# Patient Record
Sex: Female | Born: 1990 | Race: White | Hispanic: No | Marital: Single | Smoking: Current every day smoker
Health system: Southern US, Community
[De-identification: ages and names within clinical notes are randomized; demographics above are authoritative.]

## PROBLEM LIST (undated history)

## (undated) DIAGNOSIS — H269 Unspecified cataract: Secondary | ICD-10-CM

## (undated) DIAGNOSIS — F32A Depression, unspecified: Secondary | ICD-10-CM

## (undated) DIAGNOSIS — F329 Major depressive disorder, single episode, unspecified: Secondary | ICD-10-CM

## (undated) DIAGNOSIS — F419 Anxiety disorder, unspecified: Secondary | ICD-10-CM

## (undated) DIAGNOSIS — N809 Endometriosis, unspecified: Secondary | ICD-10-CM

## (undated) HISTORY — DX: Depression, unspecified: F32.A

## (undated) HISTORY — PX: NO PAST SURGERIES: SHX2092

## (undated) HISTORY — DX: Unspecified cataract: H26.9

## (undated) HISTORY — DX: Endometriosis, unspecified: N80.9

## (undated) HISTORY — DX: Anxiety disorder, unspecified: F41.9

## (undated) HISTORY — DX: Major depressive disorder, single episode, unspecified: F32.9

---
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
## 2012-07-10 ENCOUNTER — Emergency Department: Payer: Self-pay | Admitting: Emergency Medicine

## 2012-07-14 LAB — BETA STREP CULTURE(ARMC)

## 2013-06-11 DIAGNOSIS — IMO0002 Reserved for concepts with insufficient information to code with codable children: Secondary | ICD-10-CM | POA: Insufficient documentation

## 2013-06-17 DIAGNOSIS — F329 Major depressive disorder, single episode, unspecified: Secondary | ICD-10-CM | POA: Insufficient documentation

## 2013-06-20 DIAGNOSIS — N809 Endometriosis, unspecified: Secondary | ICD-10-CM | POA: Insufficient documentation

## 2013-06-20 DIAGNOSIS — R030 Elevated blood-pressure reading, without diagnosis of hypertension: Secondary | ICD-10-CM

## 2013-06-29 DIAGNOSIS — M549 Dorsalgia, unspecified: Secondary | ICD-10-CM | POA: Insufficient documentation

## 2013-07-23 DIAGNOSIS — H269 Unspecified cataract: Secondary | ICD-10-CM | POA: Insufficient documentation

## 2013-10-02 DIAGNOSIS — Z720 Tobacco use: Secondary | ICD-10-CM | POA: Insufficient documentation

## 2013-11-28 DIAGNOSIS — R252 Cramp and spasm: Secondary | ICD-10-CM | POA: Insufficient documentation

## 2013-11-28 DIAGNOSIS — Z1389 Encounter for screening for other disorder: Secondary | ICD-10-CM | POA: Insufficient documentation

## 2014-01-30 DIAGNOSIS — H1010 Acute atopic conjunctivitis, unspecified eye: Secondary | ICD-10-CM | POA: Insufficient documentation

## 2014-01-30 DIAGNOSIS — R51 Headache: Secondary | ICD-10-CM

## 2014-01-30 DIAGNOSIS — F419 Anxiety disorder, unspecified: Secondary | ICD-10-CM | POA: Insufficient documentation

## 2014-01-30 DIAGNOSIS — R519 Headache, unspecified: Secondary | ICD-10-CM | POA: Insufficient documentation

## 2014-03-11 ENCOUNTER — Emergency Department: Payer: Self-pay | Admitting: Emergency Medicine

## 2014-03-11 LAB — URINALYSIS, COMPLETE
BILIRUBIN, UR: NEGATIVE
BLOOD: NEGATIVE
GLUCOSE, UR: NEGATIVE mg/dL (ref 0–75)
Ketone: NEGATIVE
NITRITE: NEGATIVE
PH: 5 (ref 4.5–8.0)
Protein: NEGATIVE
RBC,UR: 9 /HPF (ref 0–5)
SPECIFIC GRAVITY: 1.013 (ref 1.003–1.030)
WBC UR: 27 /HPF (ref 0–5)

## 2014-07-01 DIAGNOSIS — G4730 Sleep apnea, unspecified: Secondary | ICD-10-CM | POA: Insufficient documentation

## 2014-07-01 DIAGNOSIS — K219 Gastro-esophageal reflux disease without esophagitis: Secondary | ICD-10-CM | POA: Insufficient documentation

## 2014-07-01 DIAGNOSIS — J351 Hypertrophy of tonsils: Secondary | ICD-10-CM | POA: Insufficient documentation

## 2014-08-08 DIAGNOSIS — M62830 Muscle spasm of back: Secondary | ICD-10-CM | POA: Insufficient documentation

## 2014-10-14 ENCOUNTER — Ambulatory Visit: Payer: Self-pay

## 2015-11-26 ENCOUNTER — Other Ambulatory Visit: Payer: Self-pay | Admitting: Ophthalmology

## 2015-11-26 ENCOUNTER — Ambulatory Visit
Admission: RE | Admit: 2015-11-26 | Discharge: 2015-11-26 | Disposition: A | Discharge status: Home / Self Care | Payer: Self-pay | Source: Ambulatory Visit | Attending: Ophthalmology | Admitting: Ophthalmology

## 2015-11-26 DIAGNOSIS — H4710 Unspecified papilledema: Secondary | ICD-10-CM

## 2015-11-26 MED ORDER — GADOBENATE DIMEGLUMINE 529 MG/ML IV SOLN
20.0000 mL | Freq: Once | INTRAVENOUS | Status: AC | PRN
  Administered 2015-11-26: 20 mL via INTRAVENOUS

## 2015-11-27 ENCOUNTER — Ambulatory Visit (INDEPENDENT_AMBULATORY_CARE_PROVIDER_SITE_OTHER): Payer: Self-pay | Admitting: Neurology

## 2015-11-27 ENCOUNTER — Encounter: Payer: Self-pay | Admitting: Neurology

## 2015-11-27 ENCOUNTER — Telehealth: Payer: Self-pay | Admitting: Neurology

## 2015-11-27 ENCOUNTER — Other Ambulatory Visit (INDEPENDENT_AMBULATORY_CARE_PROVIDER_SITE_OTHER): Payer: Self-pay

## 2015-11-27 VITALS — BP 132/86 | HR 92 | Resp 18 | Ht 60.0 in | Wt 265.0 lb

## 2015-11-27 DIAGNOSIS — G932 Benign intracranial hypertension: Secondary | ICD-10-CM

## 2015-11-27 DIAGNOSIS — E669 Obesity, unspecified: Secondary | ICD-10-CM

## 2015-11-27 LAB — CBC
HEMATOCRIT: 43.2 % (ref 36.0–46.0)
HEMOGLOBIN: 14.3 g/dL (ref 12.0–15.0)
MCHC: 33 g/dL (ref 30.0–36.0)
MCV: 88.1 fl (ref 78.0–100.0)
PLATELETS: 299 10*3/uL (ref 150.0–400.0)
RBC: 4.9 Mil/uL (ref 3.87–5.11)
RDW: 14.8 % (ref 11.5–15.5)
WBC: 10.9 10*3/uL — AB (ref 4.0–10.5)

## 2015-11-27 LAB — BASIC METABOLIC PANEL
BUN: 10 mg/dL (ref 6–23)
CALCIUM: 9.4 mg/dL (ref 8.4–10.5)
CHLORIDE: 105 meq/L (ref 96–112)
CO2: 25 meq/L (ref 19–32)
CREATININE: 0.64 mg/dL (ref 0.40–1.20)
GFR: 120.39 mL/min (ref >60.00)
GLUCOSE: 113 mg/dL — AB (ref 70–99)
Potassium: 4.3 mEq/L (ref 3.5–5.1)
Sodium: 137 mEq/L (ref 135–145)

## 2015-11-27 MED ORDER — ACETAZOLAMIDE 250 MG PO TABS: ORAL_TABLET | ORAL | Status: AC

## 2015-11-27 NOTE — Telephone Encounter (Signed)
Lmovm to rtn my call. 

## 2015-11-27 NOTE — Progress Notes (Signed)
NEUROLOGY CONSULTATION NOTE  Connie Murphy MRN: [id number] DOB: 05/29/1991  Referring provider: Dr. Lia Hopping Primary care provider: Dr. Lia Hopping  Reason for consult:  Bilateral optic nerve head edema  Dear Dr Marcy Panning:  Thank you for your kind referral of Connie Murphy for consultation of the above symptoms. Although her history is well known to you, please allow me to reiterate it for the purpose of our medical record. The patient was accompanied to the clinic by her mother who also provides collateral information. Records and images were personally reviewed where available.  HISTORY OF PRESENT ILLNESS: This is a 25 year old right-handed woman with a history of migraines, congenital cataracts, obesity, presenting for evaluation of bilateral optic nerve head edema seen on her eye doctor visit yesterday. She started noticing symptoms 3 days ago when she woke up with a dark spot in the middle of her vision in the right eye. She had a headache that day, a little different from her usual migraines, hurting "all over," but more over the middle frontal region. She saw her eye doctor yesterday and was found to have florid 3+ optic nerve head edema bilaterally, R>L with flame hemorrhages surrounding both optic nerves. VA was 20/20 in right eye, 20/60 in left eye, visual fields showed generalized depression in both eyes. She denies any eye pain. She has had intermittent pulsatile tinnitus for many years, and had told her PCP about this 2-3 years ago. He sent her for an eye exam at that time, with normal fundoscopy. She was started on Imitrex and Topamax at that time for migraines, which caused side effects. She stopped medications over a year ago. Imitrex made her feel like she could not move and caused high anxiety. She has migraines twice a week, lasting from 2-3 hours to several days, with associated nausea, vomiting, photosensitivity. She feels her ears become more sensitive before  a migraine, no visual obscurations. Tylenol then Excedrin migraine usually helps, but if migraines last for 2 days, she smokes weed and feels better after. She started having migraines in childhood, worse after her father passed away in 5.   She has a history of congenital cataracts, L>R, and has not noticed any vision changes in her left eye. She denies any diplopia, dysarthria, dysphagia, neck pain, focal numbness/tingling/weakness, bowel/bladder dysfunction. She feels some tenderness over the right nasal region and the right side of her face felt different after the MRI yesterday. Her maternal grandmother had migraines. She denies taking any new medications recently. She had a concussion 5 years ago. She reports weight gain, she usually runs between 235-245 lbs but feels she gained more since September.   She had an MRI brain and orbits with and without contrast yesterday reported as normal, optic chiasm normal, bilateral optic nerve signal and lack of enhancement appears symmetric and within normal.  PAST MEDICAL HISTORY: No past medical history on file.  PAST SURGICAL HISTORY: No past surgical history on file.  MEDICATIONS: Prn Tylenol Prn Excedrin Migraine   No current facility-administered medications on file prior to visit.    ALLERGIES: Allergies not on file  FAMILY HISTORY: No family history on file.  SOCIAL HISTORY: Social History   Social History  . Marital Status: Single    Spouse Name: N/A  . Number of Children: N/A  . Years of Education: N/A   Occupational History  . Not on file.   Social History Main Topics  . Smoking status: Not on file  . Smokeless  tobacco: Not on file  . Alcohol Use: Not on file  . Drug Use: Not on file  . Sexual Activity: Not on file   Other Topics Concern  . Not on file   Social History Narrative  . No narrative on file    REVIEW OF SYSTEMS: Constitutional: No fevers, chills, or sweats, no generalized fatigue, change in  appetite Eyes: No visual changes, double vision, eye pain Ear, nose and throat: No hearing loss, ear pain, nasal congestion, sore throat Cardiovascular: No chest pain, palpitations Respiratory:  No shortness of breath at rest or with exertion, wheezes GastrointestinaI: No nausea, vomiting, diarrhea, abdominal pain, fecal incontinence Genitourinary:  No dysuria, urinary retention or frequency Musculoskeletal:  No neck pain, + occl back pain from endometriosis Integumentary: No rash, pruritus, skin lesions Neurological: as above Psychiatric: No depression, insomnia, anxiety Endocrine: No palpitations, fatigue, diaphoresis, mood swings, change in appetite, change in weight, increased thirst Hematologic/Lymphatic:  No anemia, purpura, petechiae. Allergic/Immunologic: no itchy/runny eyes, nasal congestion, recent allergic reactions, rashes  PHYSICAL EXAM: Filed Vitals:   11/27/15 0857  BP: 132/86  Pulse: 92  Resp: 18   General: No acute distress Head:  Normocephalic/atraumatic Eyes: Fundoscopic exam: unable to visualize fundi on left due to cataract; blurred disk on right Neck: supple, no paraspinal tenderness, full range of motion Back: No paraspinal tenderness Heart: regular rate and rhythm Lungs: Clear to auscultation bilaterally. Vascular: No carotid bruits. Skin/Extremities: No rash, no edema Neurological Exam: Mental status: alert and oriented to person, place, and time, no dysarthria or aphasia, Fund of knowledge is appropriate.  Recent and remote memory are intact.  Attention and concentration are normal.    Able to name objects and repeat phrases. Cranial nerves: CN I: not tested CN II: pupils equal, round and reactive to light, visual fields intact, Fundoscopic exam: unable to visualize fundi on left due to cataract; blurred disk on right CN III, IV, VI:  full range of motion, no nystagmus, no ptosis CN V: facial sensation intact CN VII: upper and lower face symmetric CN  VIII: hearing intact to finger rub CN IX, X: gag intact, uvula midline CN XI: sternocleidomastoid and trapezius muscles intact CN XII: tongue midline Bulk & Tone: normal, no fasciculations. Motor: 5/5 throughout with no pronator drift. Sensation: intact to light touch, cold, pin, vibration and joint position sense.  No extinction to double simultaneous stimulation.  Romberg test negative Deep Tendon Reflexes: +1 throughout, no ankle clonus Plantar responses: downgoing bilaterally Cerebellar: no incoordination on finger to nose, heel to shin. No dysdiadochokinesia Gait: narrow-based and steady, able to tandem walk adequately. Tremor: none  IMPRESSION: This is a 25 year old right-handed woman with a history of migraines, congenital cataracts, obesity, presenting for bilateral optic nerve edema. MRI brain did not show any acute changes. She has a history of migraines and used to take Topamax with side effects. We discussed diagnosis of idiopathic intracranial hypertension, she is very anxious about having a lumbar puncture. Discussed the importance of doing an LP. This will be done under fluoroscopy, she will be started on Acetazolamide 250mg  1/2 tab BID for 1 week, then increase to 1 tab BID. We discussed that we would likely need to increase dose, LP would be helpful as well to guide further management. Continue with ophthalmology visits. We discussed the importance of weight loss in patient with IIH. She will follow-up in 1 months and knows to call for any changes.   Thank you for allowing me to  participate in the care of this patient. Please do not hesitate to call for any questions or concerns.   Patrcia Dolly, M.D.  CC: Dr. Marcy Panning

## 2015-11-27 NOTE — Telephone Encounter (Signed)
Patient returned my call. She states the cheapest price she could find [facility]. She states she went ahead and got the Rx because she states you & her eye doctor told her she really needed to be on medication. Wants to know if there is a cheaper alternative. She is having her LP on Monday morning.

## 2015-11-27 NOTE — Telephone Encounter (Signed)
Pt left message on the voice mail and needs to talk to someone about medication please call [phone]

## 2015-11-27 NOTE — Patient Instructions (Addendum)
1. Start acetazolamide 500mg : Take 1/2 tablet twice a day for 1 week, then increase to 1 tablet twice a day 2. Bloodwork for CBC, BMP, PT/PTT 3. Schedule lumbar pucture to measure opening pressure 4. Weight loss is an important part of your treatment 5. Follow-up with eye doctor as scheduled 6. Follow-up in 1 month, call for any changes

## 2015-11-28 ENCOUNTER — Encounter: Payer: Self-pay | Admitting: Neurology

## 2015-11-28 DIAGNOSIS — E669 Obesity, unspecified: Secondary | ICD-10-CM | POA: Insufficient documentation

## 2015-11-28 DIAGNOSIS — G932 Benign intracranial hypertension: Secondary | ICD-10-CM | POA: Insufficient documentation

## 2015-11-28 LAB — PROTIME-INR
INR: 0.96 (ref <1.50)
Prothrombin Time: 12.9 seconds (ref 11.6–15.2)

## 2015-11-28 LAB — APTT: aPTT: 35 seconds (ref 24–37)

## 2015-11-30 ENCOUNTER — Ambulatory Visit
Admission: RE | Admit: 2015-11-30 | Discharge: 2015-11-30 | Disposition: A | Discharge status: Home / Self Care | Payer: Self-pay | Source: Ambulatory Visit | Attending: Neurology | Admitting: Neurology

## 2015-11-30 ENCOUNTER — Other Ambulatory Visit: Payer: Self-pay | Admitting: Neurology

## 2015-11-30 ENCOUNTER — Encounter: Payer: Self-pay | Admitting: Radiology

## 2015-11-30 VITALS — BP 102/65 | HR 93

## 2015-11-30 DIAGNOSIS — E669 Obesity, unspecified: Secondary | ICD-10-CM | POA: Insufficient documentation

## 2015-11-30 DIAGNOSIS — Q631 Lobulated, fused and horseshoe kidney: Secondary | ICD-10-CM | POA: Insufficient documentation

## 2015-11-30 DIAGNOSIS — G932 Benign intracranial hypertension: Secondary | ICD-10-CM

## 2015-11-30 DIAGNOSIS — H269 Unspecified cataract: Secondary | ICD-10-CM | POA: Insufficient documentation

## 2015-11-30 LAB — GLUCOSE, CSF: Glucose, CSF: 70 mg/dL (ref 43–76)

## 2015-11-30 LAB — CSF CELL COUNT WITH DIFFERENTIAL
RBC COUNT CSF: 0 uL
TUBE #: 2
WBC, CSF: 4 cu mm (ref 0–5)

## 2015-11-30 LAB — PROTEIN, CSF: TOTAL PROTEIN, CSF: 36 mg/dL (ref 15–45)

## 2015-11-30 MED ORDER — DIAZEPAM 5 MG PO TABS
10.0000 mg | ORAL_TABLET | Freq: Once | ORAL | Status: AC
  Administered 2015-11-30: 10 mg via ORAL

## 2015-11-30 NOTE — Discharge Instructions (Signed)

## 2015-12-02 NOTE — Telephone Encounter (Signed)
Spoke to patient about elevated opening pressure of 42. She has mild back pain from the procedure and minimal paresthesias with starting Diamox. Instructed to increase to 250mg  BID. Acetazolamide is very expensive for them, she is applying for [facility] discount. Discussed it is important she get her medication, we may actually plan to increase dose in the future. Also discussed importance of weight loss in IIH. Patient knows to call for any changes.

## 2015-12-03 LAB — CSF CULTURE

## 2015-12-03 LAB — CSF CULTURE W GRAM STAIN
Gram Stain: NONE SEEN
Organism ID, Bacteria: NO GROWTH

## 2015-12-20 ENCOUNTER — Telehealth: Payer: Self-pay | Admitting: Neurology

## 2015-12-20 NOTE — Telephone Encounter (Signed)
Records from her ophthalmologist Dr. Willey BladeBradley King were reviewed. Patient seen 12/10/15, optic nerves demonstrated continued edema similar to 11/26/15. This is expected even with improved control over the central spinal pressure and takes time to resolve. Her vision was good with 20/20 OD and 20/40 OS. She has mild congenital cataracts in both eyes of the anterior polar type, stable. Visual field testing did not show any peripheral visual loss. She reported tolerating Diamox.

## 2015-12-25 ENCOUNTER — Ambulatory Visit: Payer: Self-pay | Admitting: Neurology

## 2019-05-24 ENCOUNTER — Other Ambulatory Visit: Payer: Self-pay

## 2019-05-24 ENCOUNTER — Emergency Department: Payer: PRIVATE HEALTH INSURANCE

## 2019-05-24 ENCOUNTER — Emergency Department
Admission: EM | Admit: 2019-05-24 | Discharge: 2019-05-25 | Disposition: A | Discharge status: Home / Self Care | Payer: PRIVATE HEALTH INSURANCE | Attending: Emergency Medicine | Admitting: Emergency Medicine

## 2019-05-24 ENCOUNTER — Encounter: Payer: Self-pay | Admitting: Emergency Medicine

## 2019-05-24 DIAGNOSIS — F17210 Nicotine dependence, cigarettes, uncomplicated: Secondary | ICD-10-CM | POA: Insufficient documentation

## 2019-05-24 DIAGNOSIS — H5789 Other specified disorders of eye and adnexa: Secondary | ICD-10-CM | POA: Insufficient documentation

## 2019-05-24 DIAGNOSIS — Z79899 Other long term (current) drug therapy: Secondary | ICD-10-CM | POA: Diagnosis not present

## 2019-05-24 MED ORDER — TETRACAINE HCL 0.5 % OP SOLN
2.0000 [drp] | Freq: Once | OPHTHALMIC | Status: AC
  Administered 2019-05-25: 2 [drp] via OPHTHALMIC
  Filled 2019-05-24: qty 4

## 2019-05-24 MED ORDER — METHYLPREDNISOLONE SODIUM SUCC 125 MG IJ SOLR
125.0000 mg | Freq: Once | INTRAMUSCULAR | Status: AC
  Administered 2019-05-25: 125 mg via INTRAVENOUS
  Filled 2019-05-24: qty 2

## 2019-05-24 MED ORDER — FLUORESCEIN SODIUM 1 MG OP STRP
1.0000 | ORAL_STRIP | Freq: Once | OPHTHALMIC | Status: AC
  Administered 2019-05-25: 01:00:00 1 via OPHTHALMIC
  Filled 2019-05-24: qty 1

## 2019-05-24 NOTE — ED Notes (Signed)
Patient transported to CT 

## 2019-05-24 NOTE — ED Provider Notes (Signed)
[facility] Emergency Department Provider Note   ____________________________________________   First MD Initiated Contact with Patient 05/24/19 2340     (approximate)  I have reviewed the triage vital signs and the nursing notes.   HISTORY  Chief Complaint Facial Swelling    HPI Connie Murphy is a 28 y.o. female who presents to the ED from home with a chief complaint of feeling like her left eye is swollen.  Denies pain or vision changes.  Patient was eating dinner approximately 2 hours ago when she started feeling like her left eye was swelling.  Patient has bilateral cataracts worse on the left which is being followed by [facility].  Does not wear corrective lenses.  Denies rash, hives, tongue or lip swelling.  Denies new exposures.  Lives alone and denies any contact with persons with pink eye.  Denies fever, cough, chest pain, shortness of breath, abdominal pain, nausea or vomiting.       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   Adiposity 11/30/2015   Idiopathic intracranial hypertension 11/28/2015   Obesity 11/28/2015   Back muscle spasm 08/08/2014   Acid reflux 07/01/2014   Enlarged tonsils 07/01/2014   Morbid (severe) obesity due to excess calories (HCC) 07/01/2014   Apnea, sleep 07/01/2014   Allergic conjunctivitis 01/30/2014   Anxiety 01/30/2014   Cephalalgia 01/30/2014   Encounter for screening for other disorder 11/28/2013   Spasm 11/28/2013   Current tobacco use 10/02/2013   Cataract, immature 07/23/2013   Back ache 06/29/2013   Blood pressure elevated 06/20/2013   Endometriosis 06/20/2013   Major depressive disorder, single episode 06/17/2013   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 Authorizing Provider  acetaminophen (TYLENOL) 500 MG tablet Take 500 mg by mouth. As needed for headache    [provider]  acetaZOLAMIDE (DIAMOX) 250 MG tablet Take 1/2 tablet twice a day for 1 week, then increase to 1 tablet twice a day 11/27/15   Van ClinesAquino, Karen M, MD  Aspirin-Acetaminophen-Caffeine (EXCEDRIN MIGRAINE PO) Take by mouth. As needed for headaches    [provider]    Allergies Tomato, Zithromax [azithromycin], Azithromycin, and Tomato  Family History  Problem Relation Age of Onset   Depression Mother    Anxiety disorder Mother    Fibromyalgia Mother    Diabetes Father    COPD Father    Hypertension Father    Congestive Heart Failure Father    Heart disease Maternal Grandmother    Emphysema Maternal Grandmother     Social History Social History   Tobacco Use   Smoking status: Current Every Day Smoker    Packs/day: 0.50    Types: Cigarettes   Smokeless tobacco: Never Used  Substance Use Topics   Alcohol use: Yes    Alcohol/week: 0.0 standard drinks    Comment: Rarely    Drug use: Yes    Types: Marijuana    Review of Systems  Constitutional: No fever/chills Eyes: Positive for sensation of left eye swelling.  No visual changes. ENT: No sore throat. Cardiovascular: Denies chest  pain. Respiratory: Denies shortness of breath. Gastrointestinal: No abdominal pain.  No nausea, no vomiting.  No diarrhea.  No constipation. Genitourinary: Negative for dysuria. Musculoskeletal: Negative for back pain. Skin: Negative for rash. Neurological: Negative for headaches, focal weakness or numbness.   ____________________________________________   PHYSICAL EXAM:  VITAL SIGNS: ED Triage Vitals  Enc Vitals Group     BP 05/24/19 2333 (!) 153/95     Pulse Rate 05/24/19 2333 96     Resp 05/24/19 2333 18     Temp  05/24/19 2332 98 F (36.7 C)     Temp Source 05/24/19 2332 Oral     SpO2 05/24/19 2333 98 %     Weight 05/24/19 2332 260 lb (117.9 kg)     Height 05/24/19 2332 5\' 2"  (1.575 m)     Head Circumference --      Peak Flow --      Pain Score 05/24/19 2332 0     Pain Loc --      Pain Edu? --      Excl. in GC? --     Constitutional: Alert and oriented. Well appearing and in no acute distress. Eyes: Left conjunctive a reddened. PERRL. EOMI. Visible cataracts left greater than right. VA noted.  Applied tetracaine to left eye and examined under Woods lamp: No COA.  Tonometry pressures left eye 12, 18. Fundoscopy difficult due to no dilation plus cataract but no gross papilledema noted. Head: Atraumatic. Nose: No congestion/rhinnorhea. Mouth/Throat: Mucous membranes are moist.  No tongue or lip angioedema. Neck: No stridor.  No neck swelling. Cardiovascular: Normal rate, regular rhythm. Grossly normal heart sounds.  Good peripheral circulation. Respiratory: Normal respiratory effort.  No retractions. Lungs CTAB. Gastrointestinal: Soft and nontender. No distention. No abdominal bruits. No CVA tenderness. Musculoskeletal: No lower extremity tenderness nor edema.  No joint effusions. Neurologic:  Normal speech and language. No gross focal neurologic deficits are appreciated. No gait instability. Skin:  Skin is warm, dry and intact. No rash noted.  No urticaria. Psychiatric: Mood and affect are normal. Speech and behavior are normal.  ____________________________________________   LABS (all labs ordered are listed, but only abnormal results are displayed)  Labs Reviewed  CBC WITH DIFFERENTIAL/PLATELET - Abnormal; Notable for the following components:      Result Value   WBC 14.6 (*)    Neutro Abs 9.2 (*)    Monocytes Absolute 1.1 (*)    Abs Immature Granulocytes 0.09 (*)    All other components within normal limits  BASIC METABOLIC PANEL - Abnormal; Notable for the following components:    Glucose, Bld 117 (*)    All other components within normal limits  TSH  T4, FREE   ____________________________________________  EKG  None ____________________________________________  RADIOLOGY  ED MD interpretation:  None  Official radiology report(s): Ct Orbits Wo Contrast  Result Date: 05/25/2019 CLINICAL DATA:  Left eye swelling EXAM: CT ORBITS WITHOUT CONTRAST TECHNIQUE: Multidetector CT images were obtained using the standard protocol without intravenous contrast. COMPARISON:  None. FINDINGS: Orbits: --Globes: Normal. --Bony orbit: Normal. --Preseptal soft tissues: Mild edema of the left eyelid. Otherwise normal preseptal soft tissues. --Intra- and extraconal orbital fat: Normal. No inflammatory stranding. --Optic nerves: Normal. --Lacrimal glands and fossae: Normal. --Extraocular muscles: Normal. Visualized sinuses:  No fluid levels or advanced mucosal thickening. Soft tissues: Normal. Limited intracranial: Normal. IMPRESSION: Mild edema of the left eyelid. No abscess or drainable fluid collection. No orbital cellulitis. Electronically Signed   By: Chrisandra Netters.D.  On: 05/25/2019 00:12    ____________________________________________   PROCEDURES  Procedure(s) performed (including Critical Care):  Procedures   ____________________________________________   INITIAL IMPRESSION / ASSESSMENT AND PLAN / ED COURSE  As part of my medical decision making, I reviewed the following data within the [facility] notes reviewed and incorporated, Labs reviewed, Old chart reviewed and Notes from prior ED visits     Connie Murphy was evaluated in Emergency Department on 05/25/2019 for the symptoms described in the history of present illness. She was evaluated in the context of the global COVID-19 pandemic, which necessitated consideration that the patient might be at risk for infection with the SARS-CoV-2 virus that causes COVID-19. Institutional  protocols and algorithms that pertain to the evaluation of patients at risk for COVID-19 are in a state of rapid change based on information released by regulatory bodies including the CDC and federal and state organizations. These policies and algorithms were followed during the patient's care in the ED.    28 year old female who presents with reddened left conjunctive and feeling like her left eye is swollen.  Differential diagnosis includes but is not limited to proptosis, conjunctivitis, pre-/orbital cellulitis, foreign body, glaucoma, etc.  There is no appreciable swelling on exam.  Will obtain basic lab work including thyroid panel, CT orbits.  Obtain visual acuity, perform eye exam with Woods lamp.  Clinical Course as of May 25 515  Sat May 25, 2019  0215 Looking through patient's records, I see she was seen in 2017 for papilledema and subsequently found to have idiopathic intracranial hypertension.  She denies headaches since March.  Will discuss with ophthalmology for close follow-up.   [JS]  [id number] Left message for ophthalmology.  Will also send in basket message for close follow-up.  Patient states overall she is feeling better and feels like her eye swelling has decreased. Discharged home on antibiotic eyedrop.  Strict return precautions given.  Patient verbalized understanding agrees with plan of care.   [JS]    Clinical Course User Index [JS] Paulette Blanch, MD     ____________________________________________   FINAL CLINICAL IMPRESSION(S) / ED DIAGNOSES  Final diagnoses:  Irritation of left eye     ED Discharge Orders    None       Note:  This document was prepared using Dragon voice recognition software and may include unintentional dictation errors.   Paulette Blanch, MD 05/25/19 [phone]

## 2019-05-24 NOTE — ED Triage Notes (Signed)
Patient with complaint of swelling to her left eye that started about 2 hours ago.

## 2019-05-25 LAB — CBC WITH DIFFERENTIAL/PLATELET
Abs Immature Granulocytes: 0.09 10*3/uL — ABNORMAL HIGH (ref 0.00–0.07)
Basophils Absolute: 0.1 10*3/uL (ref 0.0–0.1)
Basophils Relative: 1 %
Eosinophils Absolute: 0.4 10*3/uL (ref 0.0–0.5)
Eosinophils Relative: 3 %
HCT: 38.4 % (ref 36.0–46.0)
Hemoglobin: 12.7 g/dL (ref 12.0–15.0)
Immature Granulocytes: 1 %
Lymphocytes Relative: 26 %
Lymphs Abs: 3.8 10*3/uL (ref 0.7–4.0)
MCH: 28.6 pg (ref 26.0–34.0)
MCHC: 33.1 g/dL (ref 30.0–36.0)
MCV: 86.5 fL (ref 80.0–100.0)
Monocytes Absolute: 1.1 10*3/uL — ABNORMAL HIGH (ref 0.1–1.0)
Monocytes Relative: 7 %
Neutro Abs: 9.2 10*3/uL — ABNORMAL HIGH (ref 1.7–7.7)
Neutrophils Relative %: 62 %
Platelets: 296 10*3/uL (ref 150–400)
RBC: 4.44 MIL/uL (ref 3.87–5.11)
RDW: 13.4 % (ref 11.5–15.5)
WBC: 14.6 10*3/uL — ABNORMAL HIGH (ref 4.0–10.5)
nRBC: 0 % (ref 0.0–0.2)

## 2019-05-25 LAB — BASIC METABOLIC PANEL
Anion gap: 8 (ref 5–15)
BUN: 13 mg/dL (ref 6–20)
CO2: 24 mmol/L (ref 22–32)
Calcium: 9 mg/dL (ref 8.9–10.3)
Chloride: 103 mmol/L (ref 98–111)
Creatinine, Ser: 0.66 mg/dL (ref 0.44–1.00)
GFR calc Af Amer: >60 mL/min (ref >60)
GFR calc non Af Amer: >60 mL/min (ref >60)
Glucose, Bld: 117 mg/dL — ABNORMAL HIGH (ref 70–99)
Potassium: 3.9 mmol/L (ref 3.5–5.1)
Sodium: 135 mmol/L (ref 135–145)

## 2019-05-25 LAB — TSH: TSH: 2.337 u[IU]/mL (ref 0.350–4.500)

## 2019-05-25 LAB — T4, FREE: Free T4: 0.67 ng/dL (ref 0.61–1.12)

## 2019-05-25 MED ORDER — TOBRAMYCIN 0.3 % OP SOLN
2.0000 [drp] | OPHTHALMIC | Status: DC
  Administered 2019-05-25: 2 [drp] via OPHTHALMIC
  Filled 2019-05-25: qty 5

## 2019-05-25 NOTE — Discharge Instructions (Addendum)
1.  Apply Tobrex eyedrops 2 drops to left eye every 4 hours while awake x7 days. 2.  Return to the ER for worsening symptoms, persistent vomiting, vision changes or other concerns.

## 2019-05-25 NOTE — ED Notes (Signed)
ED Provider at bedside. 

## 2019-05-25 NOTE — ED Notes (Signed)
Woods lamp, Tonopen, and meds at beside. MD aware

## 2020-07-23 IMAGING — CT CT ORBITS W/O CM
3 series · 15 of 47 positions shown, 18 images · non-contrast
Comparison: None.

CLINICAL DATA: Left eye swelling

EXAM:
CT ORBITS WITHOUT CONTRAST
TECHNIQUE: Multidetector CT images were obtained using the standard protocol
without intravenous contrast.

[Series 3: orbits 2.0 h30s st · axial · 0.35mm/px · z∈[+382,+480]mm · 9 of 57 slices shown, 12 images]
[im 4/57  brain]
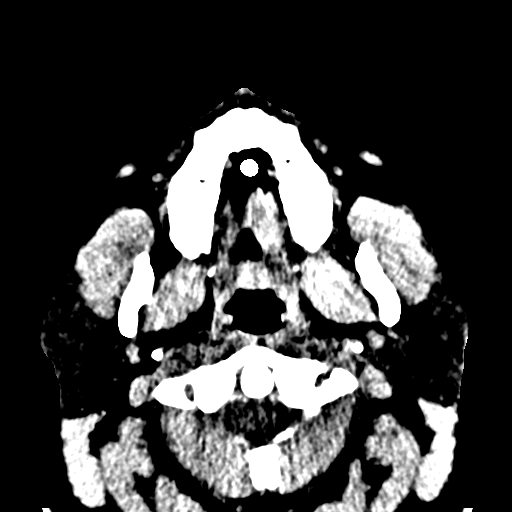
[im 4/57  bone]
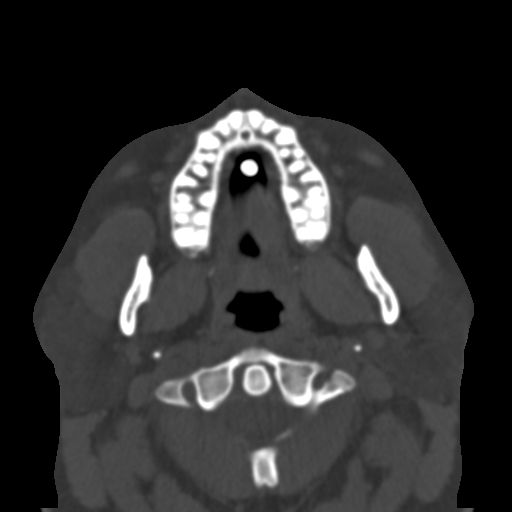
[im 10/57  bone]
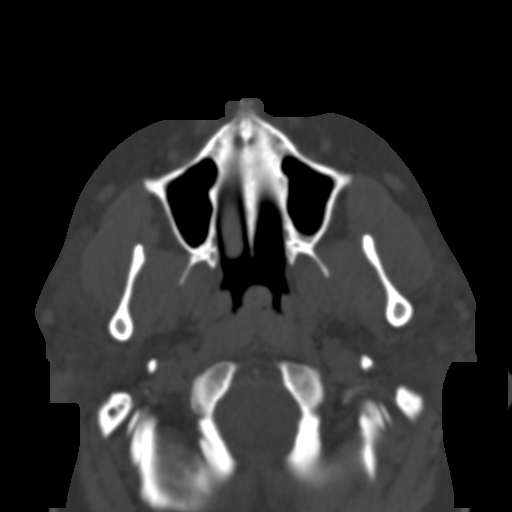
[im 16/57  bone]
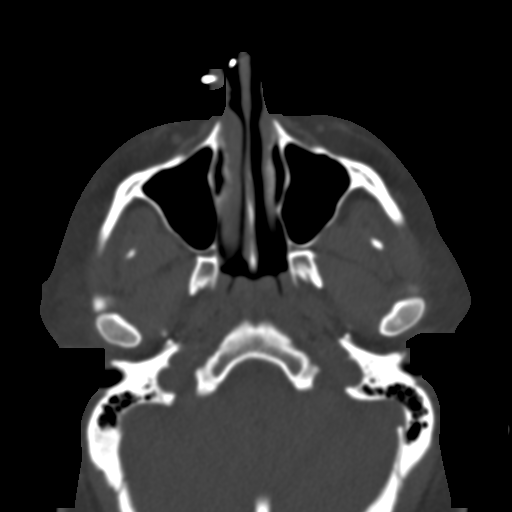
[im 22/57  bone]
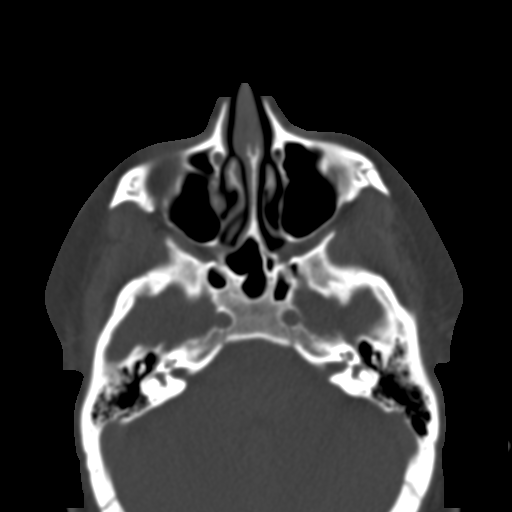
[im 29/57  brain]
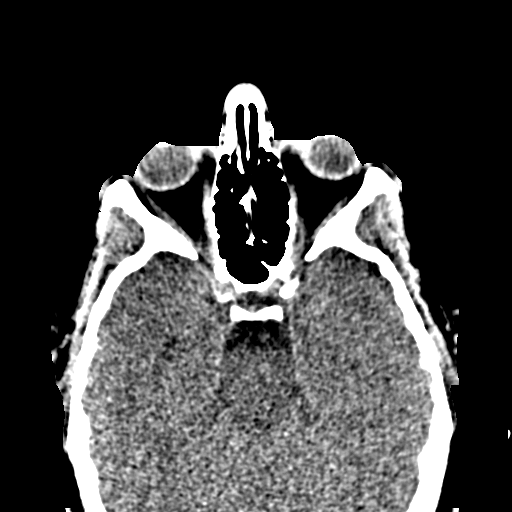
[im 29/57  bone]
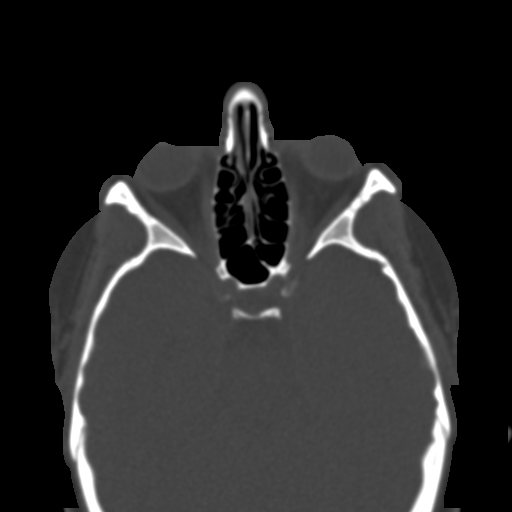
[im 35/57  bone]
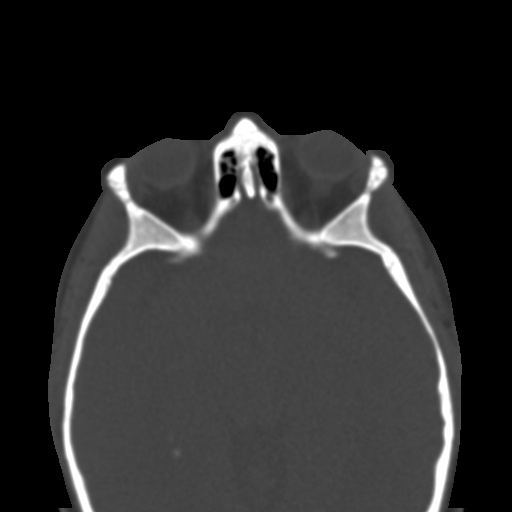
[im 41/57  bone]
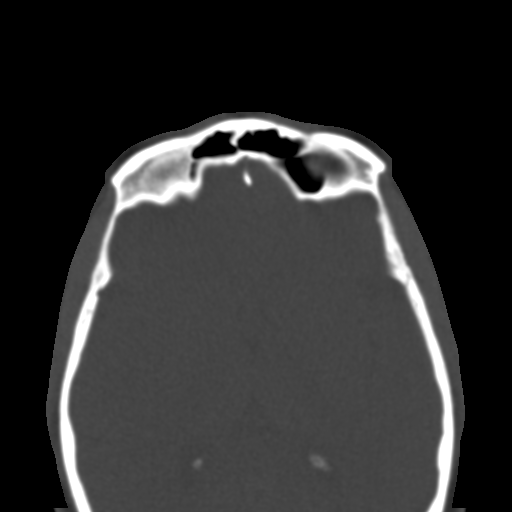
[im 47/57  bone]
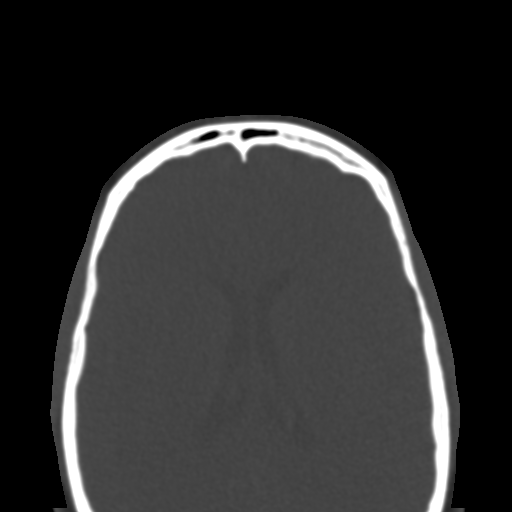
[im 53/57  brain]
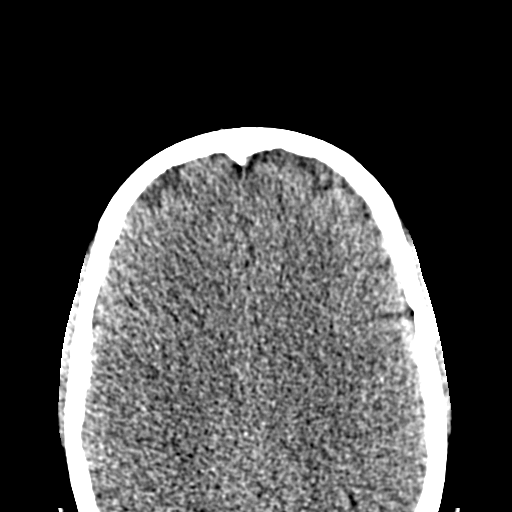
[im 53/57  bone]
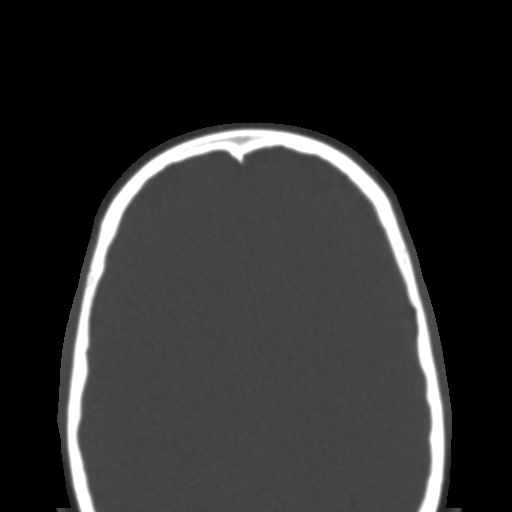

[Series 8: orbits 2.0 coronal · coronal · 0.26mm/px · 3 of 77 slices shown]
[im 26/77  bone]
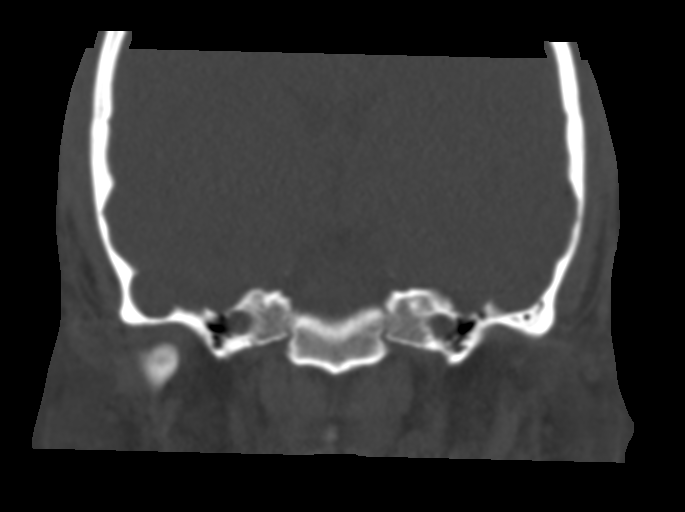
[im 34/77  bone]
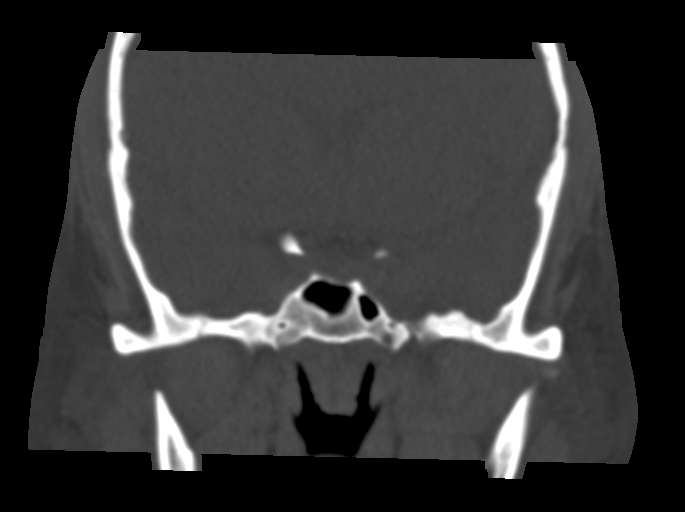
[im 43/77  bone]
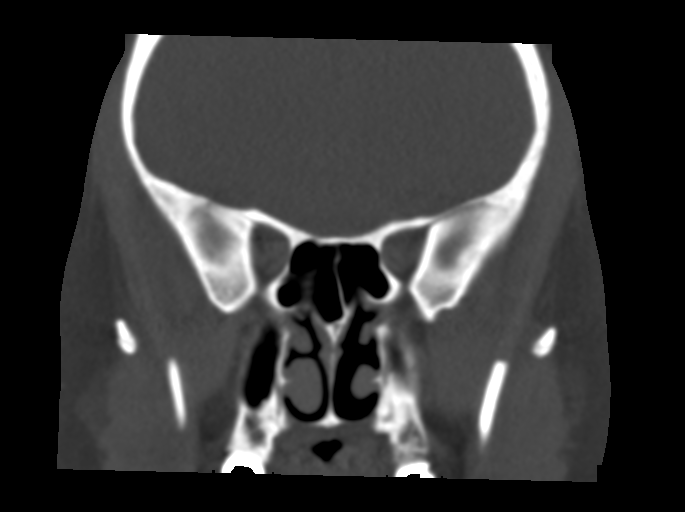

[Series 9: orbits 2.0 sagittal · sagittal · 0.26mm/px · 3 of 81 slices shown]
[im 27/81  bone]
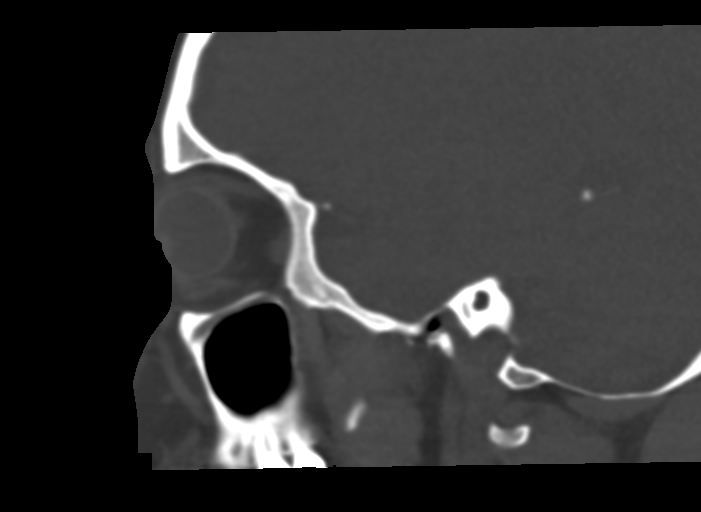
[im 41/81  bone]
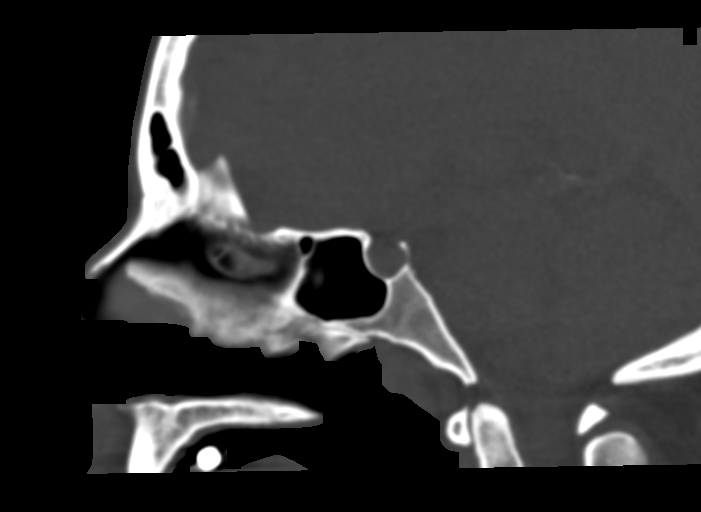
[im 54/81  bone]
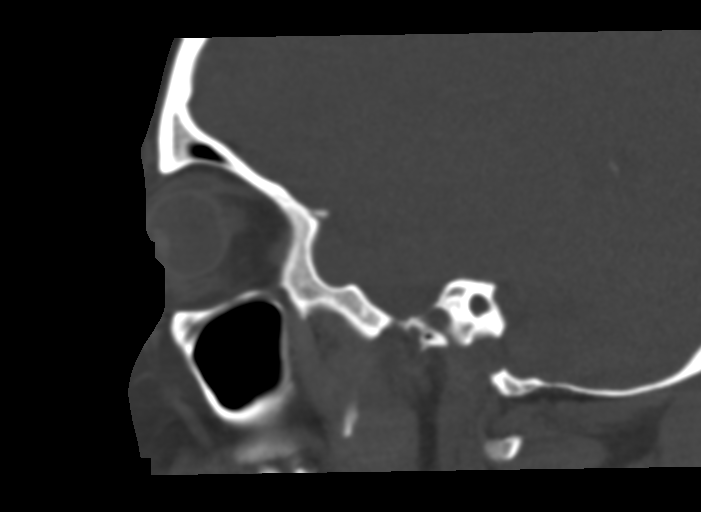

[15 of 47 positions shown; findings below may reference images not displayed]

FINDINGS: Orbits:

--Globes: Normal.

--Bony orbit: Normal.

--Preseptal soft tissues: Mild edema of the left eyelid. Otherwise
normal preseptal soft tissues.

--Intra- and extraconal orbital fat: Normal. No inflammatory
stranding.

--Optic nerves: Normal.

--Lacrimal glands and fossae: Normal.

--Extraocular muscles: Normal.

Visualized sinuses:  No fluid levels or advanced mucosal thickening.

Soft tissues: Normal.

Limited intracranial: Normal.
IMPRESSION: Mild edema of the left eyelid. No abscess or drainable fluid
collection. No orbital cellulitis.

## 2021-02-17 ENCOUNTER — Other Ambulatory Visit: Payer: Self-pay

## 2021-02-17 ENCOUNTER — Encounter: Payer: Self-pay | Admitting: Emergency Medicine

## 2021-02-17 ENCOUNTER — Ambulatory Visit
Admission: EM | Admit: 2021-02-17 | Discharge: 2021-02-17 | Disposition: A | Discharge status: Home / Self Care | Payer: Managed Care, Other (non HMO) | Attending: Family Medicine | Admitting: Family Medicine

## 2021-02-17 DIAGNOSIS — R519 Headache, unspecified: Secondary | ICD-10-CM | POA: Diagnosis not present

## 2021-02-17 DIAGNOSIS — R509 Fever, unspecified: Secondary | ICD-10-CM | POA: Insufficient documentation

## 2021-02-17 DIAGNOSIS — Z20822 Contact with and (suspected) exposure to covid-19: Secondary | ICD-10-CM | POA: Diagnosis not present

## 2021-02-17 DIAGNOSIS — Z8616 Personal history of COVID-19: Secondary | ICD-10-CM | POA: Insufficient documentation

## 2021-02-17 DIAGNOSIS — B349 Viral infection, unspecified: Secondary | ICD-10-CM | POA: Diagnosis not present

## 2021-02-17 DIAGNOSIS — M791 Myalgia, unspecified site: Secondary | ICD-10-CM | POA: Diagnosis not present

## 2021-02-17 DIAGNOSIS — F17210 Nicotine dependence, cigarettes, uncomplicated: Secondary | ICD-10-CM | POA: Diagnosis not present

## 2021-02-17 DIAGNOSIS — R112 Nausea with vomiting, unspecified: Secondary | ICD-10-CM | POA: Insufficient documentation

## 2021-02-17 LAB — INFLUENZA A AND B ANTIGEN (CONVERTED LAB)
INFLUENZA A ANTIGEN, POC: NEGATIVE
INFLUENZA B ANTIGEN, POC: NEGATIVE

## 2021-02-17 MED ORDER — ONDANSETRON HCL 4 MG PO TABS: 4.0000 mg | ORAL_TABLET | Freq: Four times a day (QID) | ORAL | 0 refills | Status: AC | PRN

## 2021-02-17 MED ORDER — IBUPROFEN 800 MG PO TABS: 800.0000 mg | ORAL_TABLET | Freq: Three times a day (TID) | ORAL | 0 refills | Status: AC | PRN

## 2021-02-17 NOTE — Discharge Instructions (Addendum)

## 2021-02-17 NOTE — ED Provider Notes (Signed)
[facility]    CSN: [id number] Arrival date & time: 02/17/21  1925      History   Chief Complaint Chief Complaint  Patient presents with   Back Pain   Migraine   Fever    HPI Connie Murphy is a 30 y.o. female presenting for body aches, primarily in the back and legs, fever up to 101 degrees, nausea and vomiting as well as some loose stools.  Has had headache too. She denies any cough, congestion, sore throat, chest discomfort or breathing difficulty.  No abdominal pain.  Patient states that she had COVID-19 5 months ago and had similar symptoms.  She is concerned about that again.  She has been vaccinated for COVID-19 x2.  Denies any sick contacts and no known exposure to COVID-19 or influenza.  Has not been taking any over-the-counter medications for her cough but has taken Tylenol for the fever.  Temperature in clinic is currently 98.9 degrees.  Patient has no other concerns.  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   Adiposity 11/30/2015   Idiopathic intracranial hypertension 11/28/2015   Obesity 11/28/2015   Back muscle spasm 08/08/2014   Acid reflux 07/01/2014   Enlarged tonsils 07/01/2014   Morbid (severe) obesity due to excess calories (HCC) 07/01/2014   Apnea, sleep 07/01/2014   Allergic conjunctivitis 01/30/2014   Anxiety 01/30/2014   Cephalalgia 01/30/2014   Encounter for screening for other disorder 11/28/2013   Spasm 11/28/2013   Current tobacco use 10/02/2013   Cataract, immature 07/23/2013   Back ache 06/29/2013   Blood pressure elevated 06/20/2013   Endometriosis 06/20/2013   Major depressive disorder, single episode 06/17/2013   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      Home Medications    Prior to Admission medications   Medication Sig Start Date End Date Taking? Authorizing Provider  ibuprofen (ADVIL) 800 MG tablet Take 1 tablet (800 mg total) by mouth every 8 (eight) hours as needed for fever, headache or moderate pain. 02/17/21  Yes Eusebio Friendly B, PA-C  ondansetron (ZOFRAN) 4 MG tablet Take 1 tablet (4 mg total) by mouth every 6 (six) hours as needed for nausea or vomiting. 02/17/21  Yes Shirlee Latch, PA-C  acetaminophen (TYLENOL) 500 MG tablet Take 500 mg by mouth. As needed for headache    [provider]  acetaZOLAMIDE (DIAMOX) 250 MG tablet Take 1/2 tablet twice a day for 1 week, then increase to 1 tablet twice a day 11/27/15   Van Clines, MD  Aspirin-Acetaminophen-Caffeine (EXCEDRIN MIGRAINE PO) Take by mouth. As needed for headaches    [provider]    Family History Family History  Problem Relation Age of Onset   Depression Mother    Anxiety disorder Mother    Fibromyalgia Mother    Diabetes Father    COPD Father    Hypertension Father    Congestive Heart Failure Father    Heart disease Maternal Grandmother    Emphysema Maternal Grandmother     Social History Social History   Tobacco Use   Smoking  status: Every Day    Packs/day: 0.50    Pack years: 0.00    Types: Cigarettes   Smokeless tobacco: Never  Vaping Use   Vaping Use: Never used  Substance Use Topics   Alcohol use: Yes    Alcohol/week: 0.0 standard drinks    Comment: Rarely    Drug use: Yes    Types: Marijuana     Allergies   Tomato, Zithromax [azithromycin], Azithromycin, and Tomato   Review of Systems Review of Systems  Constitutional:  Positive for fatigue. Negative for chills, diaphoresis and fever.  HENT:  Negative for congestion, ear pain, rhinorrhea, sinus pressure, sinus pain and sore throat.   Respiratory:  Negative for cough and shortness of breath.   Cardiovascular:  Negative for  chest pain.  Gastrointestinal:  Positive for diarrhea, nausea and vomiting. Negative for abdominal pain.  Musculoskeletal:  Positive for back pain and myalgias. Negative for arthralgias.  Skin:  Negative for rash.  Neurological:  Positive for headaches. Negative for weakness.  Hematological:  Negative for adenopathy.    Physical Exam Triage Vital Signs ED Triage Vitals  Enc Vitals Group     BP 02/17/21 1941 112/75     Pulse Rate 02/17/21 1941 (!) 115     Resp 02/17/21 1941 18     Temp 02/17/21 1941 98.9 F (37.2 C)     Temp Source 02/17/21 1941 Oral     SpO2 02/17/21 1941 99 %     Weight 02/17/21 1942 259 lb 14.8 oz (117.9 kg)     Height 02/17/21 1942 5\' 2"  (1.575 m)     Head Circumference --      Peak Flow --      Pain Score 02/17/21 1942 6     Pain Loc --      Pain Edu? --      Excl. in GC? --    No data found.  Updated Vital Signs BP (!) 131/91 (BP Location: Left Arm)   Pulse (!) 115   Temp 98.9 F (37.2 C) (Oral)   Resp 18   Ht 5\' 2"  (1.575 m)   Wt 259 lb 14.8 oz (117.9 kg)   LMP  (LMP Unknown)   SpO2 99%   BMI 47.54 kg/m      Physical Exam Vitals and nursing note reviewed.  Constitutional:      General: She is not in acute distress.    Appearance: Normal appearance. She is obese. She is ill-appearing. She is not toxic-appearing.  HENT:     Head: Normocephalic and atraumatic.     Nose: Nose normal.     Mouth/Throat:     Mouth: Mucous membranes are moist.     Pharynx: Oropharynx is clear.  Eyes:     General: No scleral icterus.       Right eye: No discharge.        Left eye: No discharge.     Conjunctiva/sclera: Conjunctivae normal.  Cardiovascular:     Rate and Rhythm: Regular rhythm. Tachycardia present.     Heart sounds: Normal heart sounds.  Pulmonary:     Effort: Pulmonary effort is normal. No respiratory distress.     Breath sounds: Normal breath sounds. No wheezing, rhonchi or rales.  Abdominal:     Palpations: Abdomen is soft.      Tenderness: There is no abdominal tenderness.  Musculoskeletal:     Cervical back: Neck supple.  Skin:    General: Skin is dry.  Neurological:  General: No focal deficit present.     Mental Status: She is alert. Mental status is at baseline.     Motor: No weakness.     Gait: Gait normal.  Psychiatric:        Mood and Affect: Mood normal.        Behavior: Behavior normal.        Thought Content: Thought content normal.     UC Treatments / Results  Labs (all labs ordered are listed, but only abnormal results are displayed) Labs Reviewed  SARS CORONAVIRUS 2 (TAT 6-24 HRS)  POC INFLUENZA A AND B ANTIGEN (URGENT CARE ONLY)    EKG   Radiology No results found.  Procedures Procedures (including critical care time)  Medications Ordered in UC Medications - No data to display  Initial Impression / Assessment and Plan / UC Course  I have reviewed the triage vital signs and the nursing notes.  Pertinent labs & imaging results that were available during my care of the patient were reviewed by me and considered in my medical decision making (see chart for details).   30 year old female presenting for fever, fatigue, body aches (primarily in the lower back and legs, nausea/vomiting and diarrhea.  Concern for COVID-19.  Previous history of COVID-19 5 months ago and vaccine for COVID-19 x2.  Patient is currently ill-appearing, but nontoxic.  Exam is otherwise normal.  Rapid flu test performed today.  Result was negative.  PCR COVID test performed.  Current CDC guidelines, isolation protocol and ED precautions reviewed patient.  Advised patient has a viral illness based on her symptoms/clinical presentation today.  Supportive care advised at this time with.  Encouraged her to increase rest and fluids.  I have sent in Zofran for nausea and vomiting.  Advised patient to follow-up as needed for any worsening symptoms or if she is not better in the next 7 to 10 days.  Work note  given.   Final Clinical Impressions(s) / UC Diagnoses   Final diagnoses:  Viral illness  Myalgia  Fever, unspecified  Non-intractable vomiting with nausea, unspecified vomiting type     Discharge Instructions      You have received COVID testing today either for positive exposure, concerning symptoms that could be related to COVID infection, screening purposes, or re-testing after confirmed positive.  Your test obtained today checks for active viral infection in the last 1-2 weeks. If your test is negative now, you can still test positive later. So, if you do develop symptoms you should either get re-tested and/or isolate x 5 days and then strict mask use x 5 days (unvaccinated) or mask use x 10 days (vaccinated). Please follow CDC guidelines.  While Rapid antigen tests come back in 15-20 minutes, send out PCR/molecular test results typically come back within 1-3 days. In the mean time, if you are symptomatic, assume this could be a positive test and treat/monitor yourself as if you do have COVID.   We will call with test results if positive. Please download the MyChart app and set up a profile to access test results.   If symptomatic, go home and rest. Push fluids. Take Tylenol as needed for discomfort. Gargle warm salt water. Throat lozenges. Take Mucinex DM or Robitussin for cough. Humidifier in bedroom to ease coughing. Warm showers. Also review the COVID handout for more information.  COVID-19 INFECTION: The incubation period of COVID-19 is approximately 14 days after exposure, with most symptoms developing in roughly 4-5 days. Symptoms may range in  severity from mild to critically severe. Roughly 80% of those infected will have mild symptoms. People of any age may become infected with COVID-19 and have the ability to transmit the virus. The most common symptoms include: fever, fatigue, cough, body aches, headaches, sore throat, nasal congestion, shortness of breath, nausea, vomiting,  diarrhea, changes in smell and/or taste.    COURSE OF ILLNESS Some patients may begin with mild disease which can progress quickly into critical symptoms. If your symptoms are worsening please call ahead to the Emergency Department and proceed there for further treatment. Recovery time appears to be roughly 1-2 weeks for mild symptoms and 3-6 weeks for severe disease.   GO IMMEDIATELY TO ER FOR FEVER YOU ARE UNABLE TO GET DOWN WITH TYLENOL, BREATHING PROBLEMS, CHEST PAIN, FATIGUE, LETHARGY, INABILITY TO EAT OR DRINK, ETC  QUARANTINE AND ISOLATION: To help decrease the spread of COVID-19 please remain isolated if you have COVID infection or are highly suspected to have COVID infection. This means -stay home and isolate to one room in the home if you live with others. Do not share a bed or bathroom with others while ill, sanitize and wipe down all countertops and keep common areas clean and disinfected. Stay home for 5 days. If you have no symptoms or your symptoms are resolving after 5 days, you can leave your house. Continue to wear a mask around others for 5 additional days. If you have been in close contact (within 6 feet) of someone diagnosed with COVID 19, you are advised to quarantine in your home for 14 days as symptoms can develop anywhere from 2-14 days after exposure to the virus. If you develop symptoms, you  must isolate.  Most current guidelines for COVID after exposure -unvaccinated: isolate 5 days and strict mask use x 5 days. Test on day 5 is possible -vaccinated: wear mask x 10 days if symptoms do not develop -You do not necessarily need to be tested for COVID if you have + exposure and  develop symptoms. Just isolate at home x10 days from symptom onset During this global pandemic, CDC advises to practice social distancing, try to stay at least 59ft away from others at all times. Wear a face covering. Wash and sanitize your hands regularly and avoid going anywhere that is not  necessary.  KEEP IN MIND THAT THE COVID TEST IS NOT 100% ACCURATE AND YOU SHOULD STILL DO EVERYTHING TO PREVENT POTENTIAL SPREAD OF VIRUS TO OTHERS (WEAR MASK, WEAR GLOVES, WASH HANDS AND SANITIZE REGULARLY). IF INITIAL TEST IS NEGATIVE, THIS MAY NOT MEAN YOU ARE DEFINITELY NEGATIVE. MOST ACCURATE TESTING IS DONE 5-7 DAYS AFTER EXPOSURE.   It is not advised by CDC to get re-tested after receiving a positive COVID test since you can still test positive for weeks to months after you have already cleared the virus.   *If you have not been vaccinated for COVID, I strongly suggest you consider getting vaccinated as long as there are no contraindications.       ED Prescriptions     Medication Sig Dispense Auth. Provider   ondansetron (ZOFRAN) 4 MG tablet Take 1 tablet (4 mg total) by mouth every 6 (six) hours as needed for nausea or vomiting. 15 tablet Eusebio Friendly B, PA-C   ibuprofen (ADVIL) 800 MG tablet Take 1 tablet (800 mg total) by mouth every 8 (eight) hours as needed for fever, headache or moderate pain. 21 tablet Gareth Morgan      PDMP not  reviewed this encounter.   Shirlee Latchaves, Jamayah Myszka B, PA-C 02/18/21 [phone]

## 2021-02-17 NOTE — ED Triage Notes (Signed)
Pt c/o lower back pain that radiates down bilateral legs. She states she also had a fever this morning of 101.0. she states she took a home covid test and was negative. She states last time she felt like this she had covid.

## 2021-02-18 LAB — SARS CORONAVIRUS 2 (TAT 6-24 HRS): SARS Coronavirus 2: NEGATIVE
